# Patient Record
Sex: Female | Born: 1970 | Race: White | Hispanic: No | Marital: Single | Smoking: Current every day smoker
Health system: Southern US, Community
[De-identification: ages and names within clinical notes are randomized; demographics above are authoritative.]

---

## 2001-02-27 ENCOUNTER — Other Ambulatory Visit: Admission: RE | Admit: 2001-02-27 | Discharge: 2001-02-27 | Payer: Self-pay | Admitting: Obstetrics and Gynecology

## 2007-03-10 ENCOUNTER — Emergency Department (HOSPITAL_COMMUNITY): Admission: EM | Admit: 2007-03-10 | Discharge: 2007-03-10 | Payer: Self-pay | Admitting: Emergency Medicine

## 2007-03-22 ENCOUNTER — Ambulatory Visit: Payer: Self-pay | Admitting: Orthopedic Surgery

## 2009-04-29 IMAGING — CR DG HAND COMPLETE 3+V*R*
4 series · 4 of 4 positions shown · non-contrast
Comparison: none

CLINICAL DATA: Injury.  Patient closed hand in car trunk.  Prior remote trauma in 9332.
 RIGHT HAND ? 4 VIEW:

[view not recorded (1 of 4)]
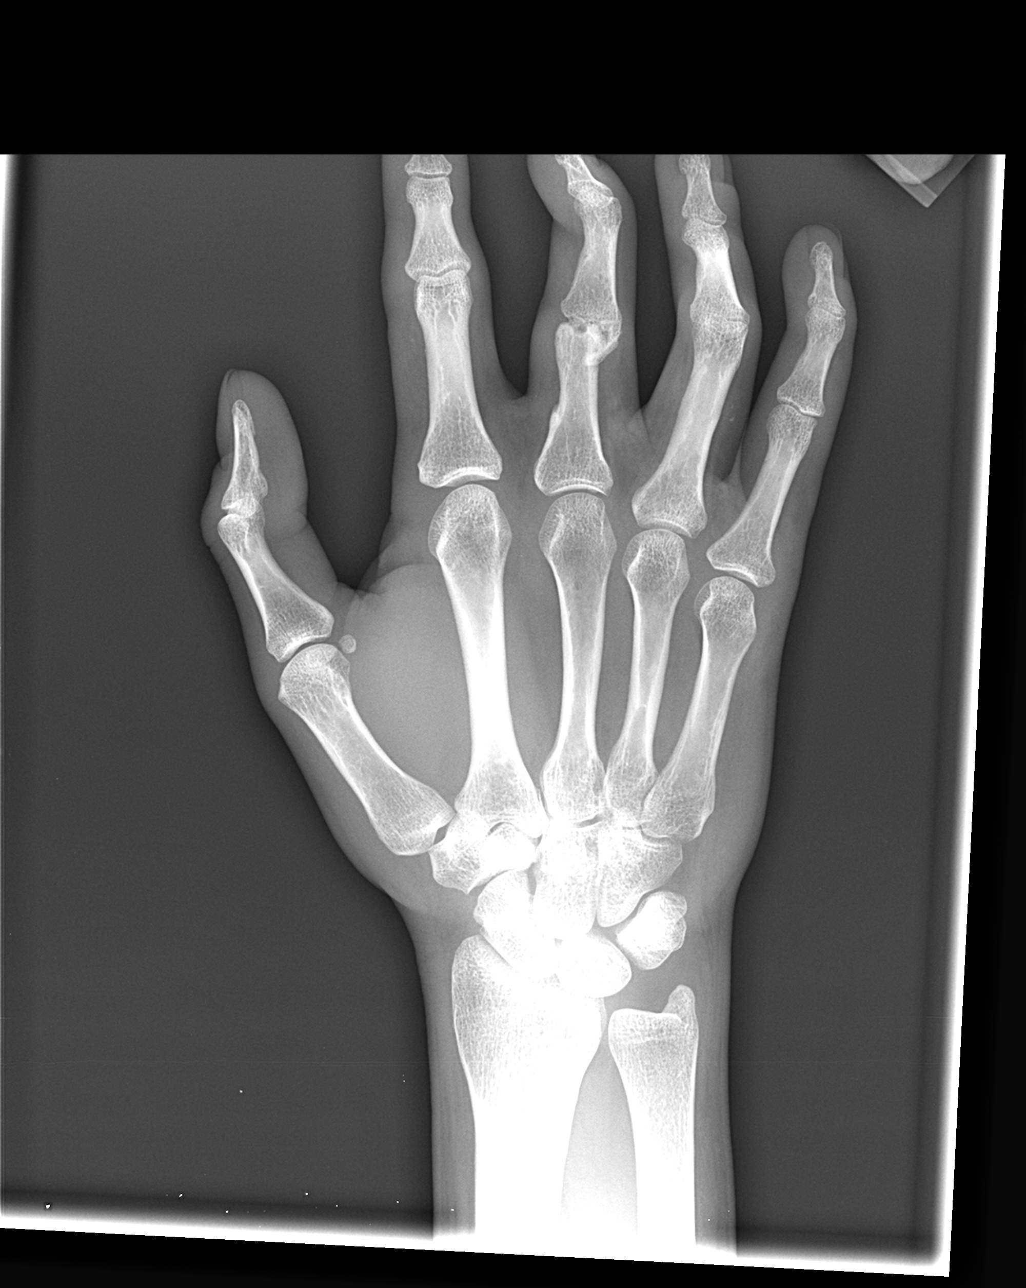

[view not recorded (2 of 4)]
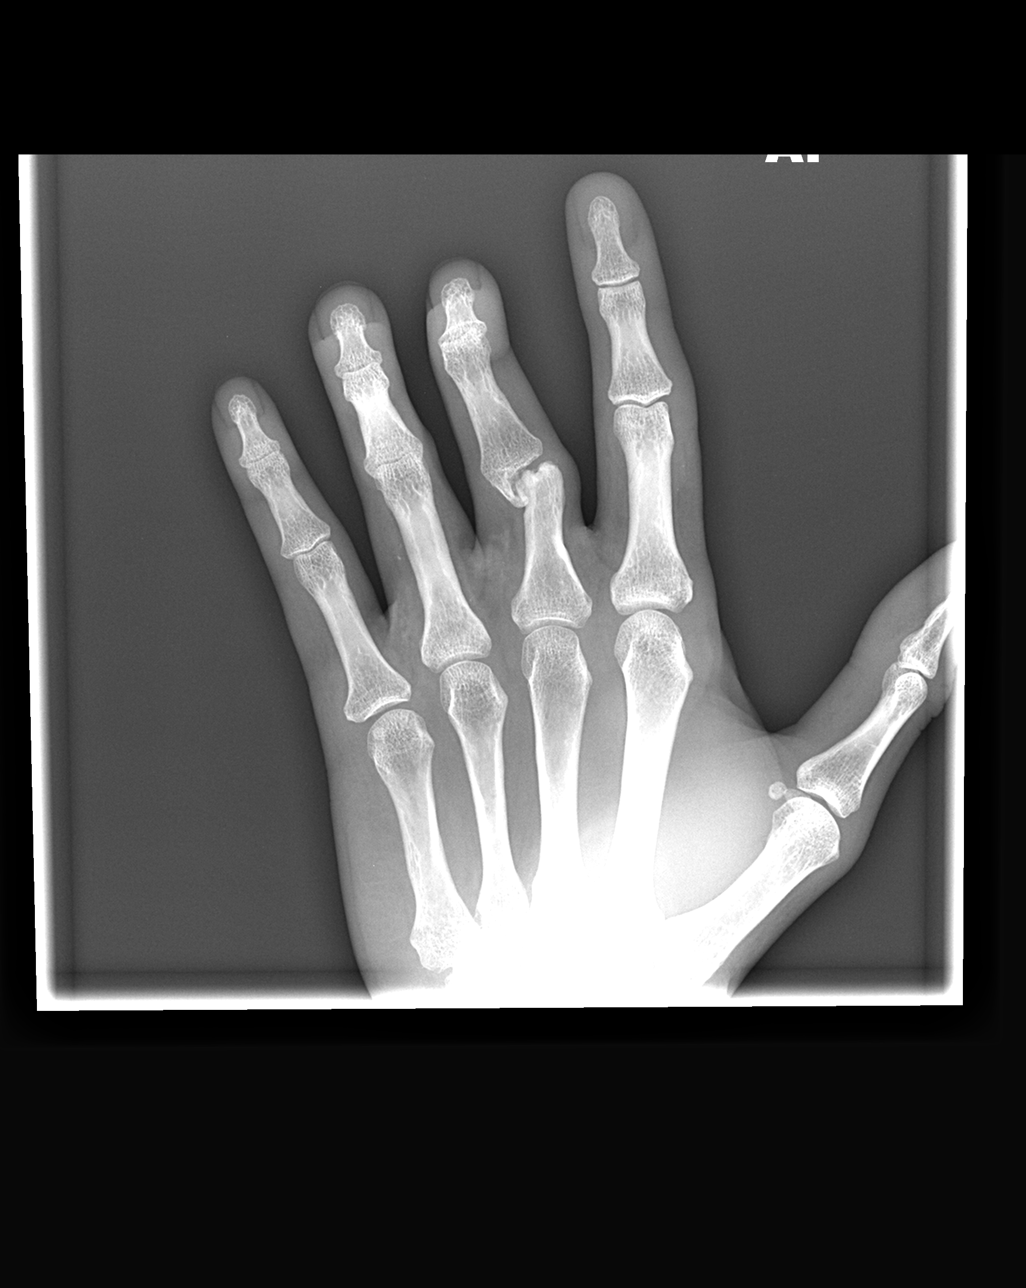

[view not recorded (3 of 4)]
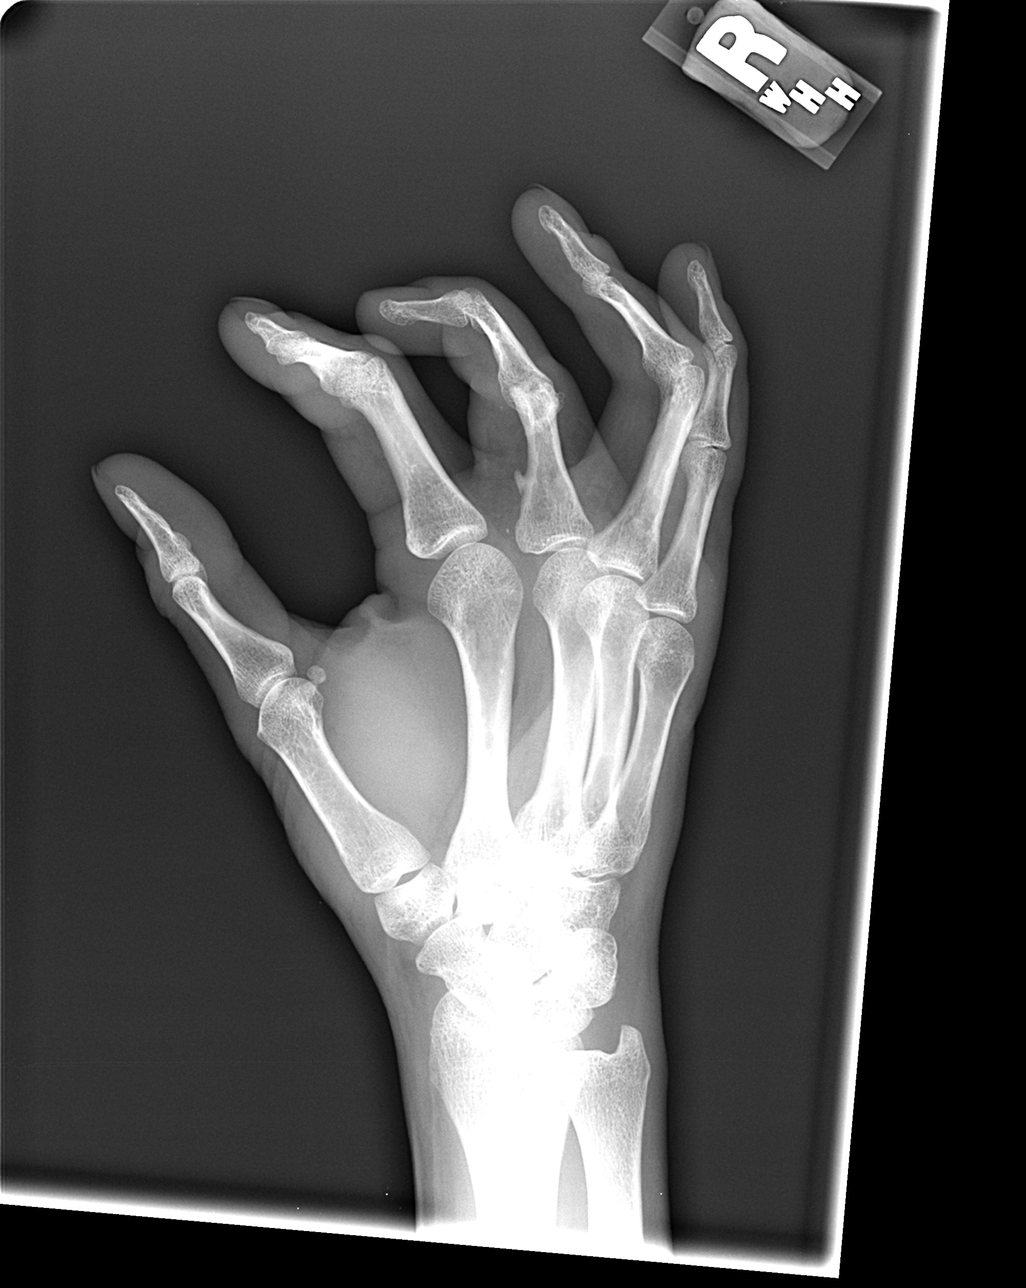

[view not recorded (4 of 4)]
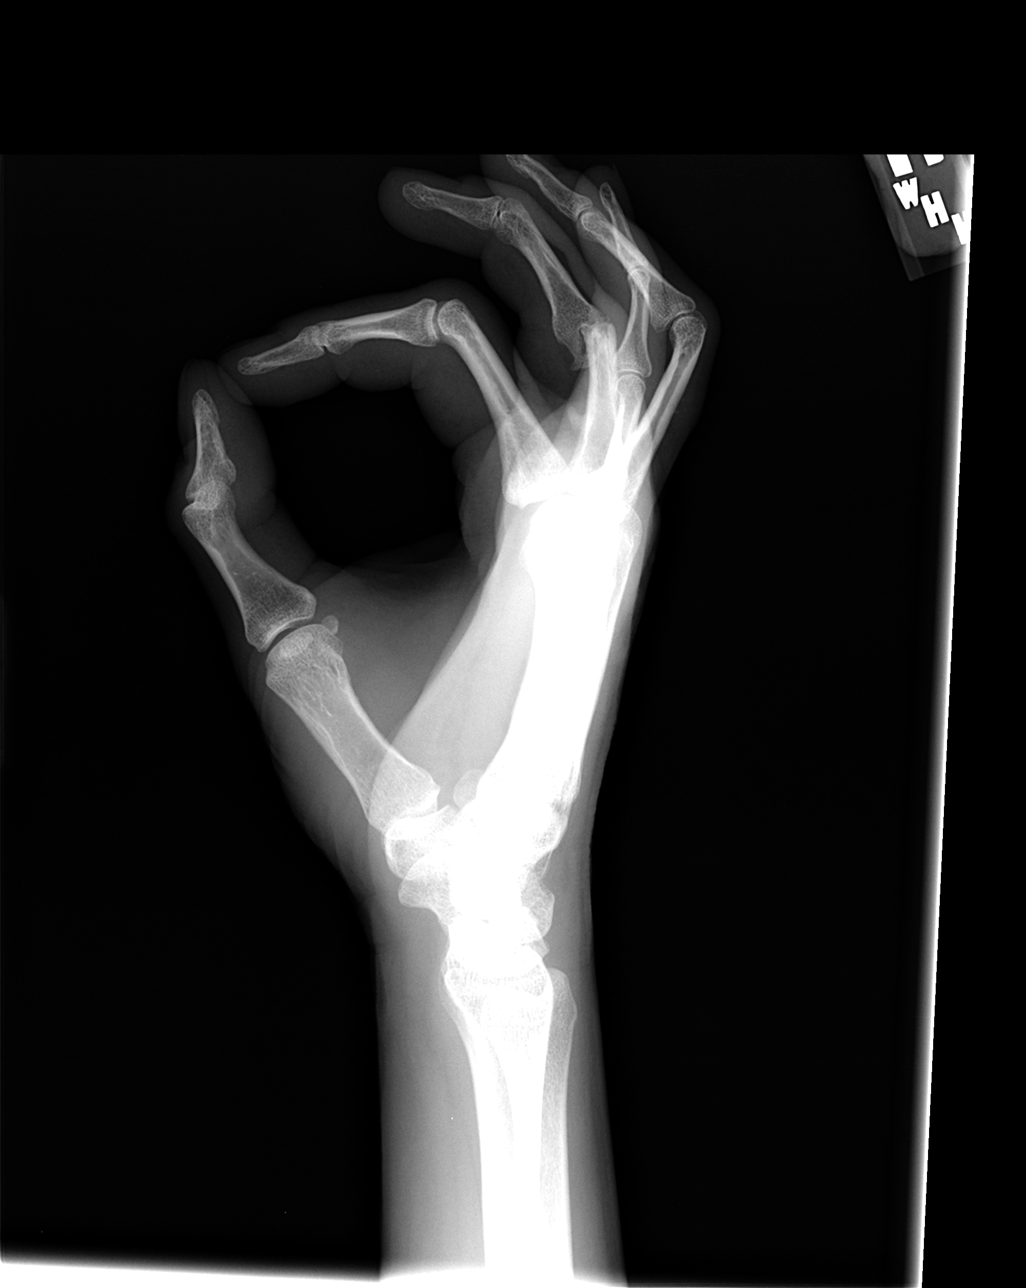

[4 of 4 positions shown; findings below may reference images not displayed]

FINDINGS: There is a remote healed fracture at the PIP joint of the long finger with the finger flexed.  The PIP joint of the ring finger is also flexed.  No acute bony or joint abnormality is identified.
IMPRESSION: No acute finding with evidence of remote trauma of the long finger and flexion deformity of the ring finger noted.

## 2017-04-20 DIAGNOSIS — G894 Chronic pain syndrome: Secondary | ICD-10-CM | POA: Diagnosis not present

## 2017-05-02 DIAGNOSIS — R5382 Chronic fatigue, unspecified: Secondary | ICD-10-CM | POA: Diagnosis not present

## 2017-05-02 DIAGNOSIS — R634 Abnormal weight loss: Secondary | ICD-10-CM | POA: Diagnosis not present

## 2017-10-26 DIAGNOSIS — G8929 Other chronic pain: Secondary | ICD-10-CM | POA: Diagnosis not present

## 2017-10-26 DIAGNOSIS — F17210 Nicotine dependence, cigarettes, uncomplicated: Secondary | ICD-10-CM | POA: Diagnosis not present

## 2017-10-26 DIAGNOSIS — R05 Cough: Secondary | ICD-10-CM | POA: Diagnosis not present

## 2017-10-26 DIAGNOSIS — R0602 Shortness of breath: Secondary | ICD-10-CM | POA: Diagnosis not present

## 2017-10-26 DIAGNOSIS — M546 Pain in thoracic spine: Secondary | ICD-10-CM | POA: Diagnosis not present

## 2017-10-26 DIAGNOSIS — J209 Acute bronchitis, unspecified: Secondary | ICD-10-CM | POA: Diagnosis not present

## 2017-11-17 DIAGNOSIS — G894 Chronic pain syndrome: Secondary | ICD-10-CM | POA: Diagnosis not present

## 2017-11-17 DIAGNOSIS — F419 Anxiety disorder, unspecified: Secondary | ICD-10-CM | POA: Diagnosis not present

## 2017-11-17 DIAGNOSIS — Z79891 Long term (current) use of opiate analgesic: Secondary | ICD-10-CM | POA: Diagnosis not present

## 2018-05-18 ENCOUNTER — Other Ambulatory Visit: Payer: Self-pay

## 2018-05-18 ENCOUNTER — Ambulatory Visit: Payer: Self-pay | Admitting: Sports Medicine

## 2018-10-05 ENCOUNTER — Ambulatory Visit: Payer: Self-pay | Admitting: Sports Medicine

## 2018-11-02 DIAGNOSIS — M549 Dorsalgia, unspecified: Secondary | ICD-10-CM | POA: Diagnosis not present

## 2018-11-02 DIAGNOSIS — Z136 Encounter for screening for cardiovascular disorders: Secondary | ICD-10-CM | POA: Diagnosis not present

## 2018-11-02 DIAGNOSIS — I10 Essential (primary) hypertension: Secondary | ICD-10-CM | POA: Diagnosis not present

## 2018-11-02 DIAGNOSIS — M722 Plantar fascial fibromatosis: Secondary | ICD-10-CM | POA: Diagnosis not present

## 2018-11-02 DIAGNOSIS — Z780 Asymptomatic menopausal state: Secondary | ICD-10-CM | POA: Diagnosis not present

## 2018-11-02 DIAGNOSIS — Z79899 Other long term (current) drug therapy: Secondary | ICD-10-CM | POA: Diagnosis not present

## 2018-11-16 ENCOUNTER — Encounter: Payer: Self-pay | Admitting: Sports Medicine

## 2018-11-16 ENCOUNTER — Ambulatory Visit: Payer: BLUE CROSS/BLUE SHIELD | Admitting: Sports Medicine

## 2018-11-16 ENCOUNTER — Ambulatory Visit (INDEPENDENT_AMBULATORY_CARE_PROVIDER_SITE_OTHER): Payer: BLUE CROSS/BLUE SHIELD

## 2018-11-16 ENCOUNTER — Other Ambulatory Visit: Payer: Self-pay

## 2018-11-16 VITALS — Temp 98.4°F | Resp 16 | Ht 66.0 in | Wt 146.0 lb

## 2018-11-16 DIAGNOSIS — M722 Plantar fascial fibromatosis: Secondary | ICD-10-CM

## 2018-11-16 DIAGNOSIS — M79672 Pain in left foot: Secondary | ICD-10-CM

## 2018-11-16 DIAGNOSIS — M79671 Pain in right foot: Secondary | ICD-10-CM

## 2018-11-16 DIAGNOSIS — Z8669 Personal history of other diseases of the nervous system and sense organs: Secondary | ICD-10-CM | POA: Diagnosis not present

## 2018-11-16 DIAGNOSIS — F17200 Nicotine dependence, unspecified, uncomplicated: Secondary | ICD-10-CM | POA: Diagnosis not present

## 2018-11-16 NOTE — Progress Notes (Signed)
Subjective: Andrea Terry is a 48 y.o. female patient presents to office with complaint of severe 10 out of 10 arch pain with swelling and knots in her arches states that the pain is constant sharp burning in nature.  Patient reports that pain is worse with standing especially when she is barefooted states that she has tried rest ice elevation Tylenol and Aleve and has tried cushions and insoles but sometimes it rubs and makes the pain worse states that her feet are especially painful after working a 12-hour shift.  Patient reports that she noticed these getting worse over the last 6 months however has had these for over a year but she feels like they are growing in getting bigger and hurting more.  Patient denies nausea vomiting fever chills or any other constitutional symptoms at this time.  Review of Systems  Musculoskeletal: Positive for joint pain and myalgias.  All other systems reviewed and are negative.   There are no active problems to display for this patient.   Current Outpatient Medications on File Prior to Visit  Medication Sig Dispense Refill  . estradiol (ESTRACE) 1 MG tablet TAKE 1 TABLET BY MOUTH ONCE (1) DAILY  4  . hydrochlorothiazide (HYDRODIURIL) 25 MG tablet TAKE 1 TABLET BY MOUTH ONCE (1) DAILY  4   No current facility-administered medications on file prior to visit.     Not on File  Objective: Physical Exam General: The patient is alert and oriented x3 in no acute distress.  Dermatology: Raised soft tissue masses in the medial arch the right arch has 2 soft tissue masses that are coalesced together that measure 3 x 2 cm and in the left arch the solitary mass measures 1.5 x 2 cm the masses are fixed along the plantar fascia supportive of plantar fibroma with no pulsation no surrounding signs of infection, skin is warm, dry and supple bilateral lower extremities. Nails 1-10 are normal. There is no erythema, edema, no eccymosis, no open lesions present. Integument is  otherwise unremarkable.  Vascular: Dorsalis Pedis pulse and Posterior Tibial pulse are 2/4 bilateral. Capillary fill time is immediate to all digits.  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  Musculoskeletal: Tenderness to palpation at the medial arch along the plantar fascial insertion at the area of fibromatous right greater than left.  There is mild guarding due to pain bilateral.  Strength acceptable for patient status.  Gait: Unassisted, Antalgic avoids full weightbearing due to pain in her arches.  Xray, Right/Left foot:  Normal osseous mineralization. Joint spaces preserved. No fracture/dislocation/boney destruction.  No calcaneal spur present with mild thickening of plantar fascia. No other soft tissue abnormalities or radiopaque foreign bodies.   Assessment and Plan: Problem List Items Addressed This Visit    None    Visit Diagnoses    Plantar fascial fibromatosis    -  Primary   Bilateral foot pain       Relevant Orders   DG Foot Complete Right   DG Foot Complete Left      -Complete examination performed.  -Xrays reviewed -Discussed with patient in detail the condition of plantar fibromas, how this occurs and general treatment options. Explained both conservative and surgical treatments.  -Ordered topical compound cream from [facility] for patient to use to these areas under occlusion to make a pain patch and to also help with  trying to shrink up the fibromas -Dispensed offloading padding for patient to use when in shoes -Advised patient if her pain is improving we can add on physical therapy to help however if pain is not better and is worsening and the masses are getting larger would recommend surgical excision however I did make patient well aware of the likelihood of high recurrence continued pain and  problems after excision of these masses; patient expressed understanding -Recommend patient to ice affected area 1-2x daily or as needed. -Patient to return to office in 4 weeks for follow up or sooner if problems or questions arise.  Asencion Islam, DPM

## 2018-11-16 NOTE — Progress Notes (Signed)
   Subjective:    Patient ID: Andrea Terry, female    DOB: Aug 16, 1970, 48 y.o.   MRN: [id number]  HPI    Review of Systems  Musculoskeletal: Positive for arthralgias, joint swelling and myalgias.  All other systems reviewed and are negative.      Objective:   Physical Exam        Assessment & Plan:

## 2018-11-19 ENCOUNTER — Other Ambulatory Visit: Payer: Self-pay | Admitting: Sports Medicine

## 2018-11-19 DIAGNOSIS — M79671 Pain in right foot: Secondary | ICD-10-CM

## 2018-11-19 DIAGNOSIS — M720 Palmar fascial fibromatosis [Dupuytren]: Secondary | ICD-10-CM

## 2018-11-19 DIAGNOSIS — M722 Plantar fascial fibromatosis: Secondary | ICD-10-CM

## 2018-12-14 ENCOUNTER — Ambulatory Visit: Payer: BLUE CROSS/BLUE SHIELD | Admitting: Sports Medicine

## 2019-05-01 DIAGNOSIS — I10 Essential (primary) hypertension: Secondary | ICD-10-CM | POA: Diagnosis not present

## 2019-05-01 DIAGNOSIS — Z780 Asymptomatic menopausal state: Secondary | ICD-10-CM | POA: Diagnosis not present

## 2019-05-01 DIAGNOSIS — Z23 Encounter for immunization: Secondary | ICD-10-CM | POA: Diagnosis not present

## 2019-05-01 DIAGNOSIS — E785 Hyperlipidemia, unspecified: Secondary | ICD-10-CM | POA: Diagnosis not present

## 2019-08-23 DIAGNOSIS — I10 Essential (primary) hypertension: Secondary | ICD-10-CM | POA: Diagnosis not present

## 2019-08-23 DIAGNOSIS — Z780 Asymptomatic menopausal state: Secondary | ICD-10-CM | POA: Diagnosis not present

## 2019-08-23 DIAGNOSIS — J302 Other seasonal allergic rhinitis: Secondary | ICD-10-CM | POA: Diagnosis not present

## 2020-06-10 DIAGNOSIS — S8992XA Unspecified injury of left lower leg, initial encounter: Secondary | ICD-10-CM | POA: Diagnosis not present

## 2020-06-10 DIAGNOSIS — W2203XA Walked into furniture, initial encounter: Secondary | ICD-10-CM | POA: Diagnosis not present

## 2020-06-10 DIAGNOSIS — M79605 Pain in left leg: Secondary | ICD-10-CM | POA: Diagnosis not present

## 2020-06-10 DIAGNOSIS — T148XXA Other injury of unspecified body region, initial encounter: Secondary | ICD-10-CM | POA: Diagnosis not present
